# Patient Record
Sex: Male | Born: 1990 | Race: White | Hispanic: No | Marital: Married | Smoking: Current some day smoker
Health system: Southern US, Community
[De-identification: ages and names within clinical notes are randomized; demographics above are authoritative.]

## PROBLEM LIST (undated history)

## (undated) DIAGNOSIS — F419 Anxiety disorder, unspecified: Secondary | ICD-10-CM

## (undated) DIAGNOSIS — F329 Major depressive disorder, single episode, unspecified: Secondary | ICD-10-CM

## (undated) DIAGNOSIS — F1910 Other psychoactive substance abuse, uncomplicated: Secondary | ICD-10-CM

## (undated) DIAGNOSIS — F32A Depression, unspecified: Secondary | ICD-10-CM

## (undated) HISTORY — PX: NO PAST SURGERIES: SHX2092

## (undated) HISTORY — DX: Other psychoactive substance abuse, uncomplicated: F19.10

## (undated) HISTORY — DX: Depression, unspecified: F32.A

## (undated) HISTORY — DX: Anxiety disorder, unspecified: F41.9

## (undated) HISTORY — DX: Major depressive disorder, single episode, unspecified: F32.9

---
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
## 2013-05-14 ENCOUNTER — Inpatient Hospital Stay: Payer: Self-pay | Admitting: Psychiatry

## 2013-05-14 LAB — CBC WITH DIFFERENTIAL/PLATELET
BASOS ABS: 0.1 10*3/uL (ref 0.0–0.1)
BASOS PCT: 1.3 %
EOS PCT: 1.5 %
Eosinophil #: 0.2 10*3/uL (ref 0.0–0.7)
HCT: 45.3 % (ref 40.0–52.0)
HGB: 15.6 g/dL (ref 13.0–18.0)
Lymphocyte #: 2.3 10*3/uL (ref 1.0–3.6)
Lymphocyte %: 21.7 %
MCH: 30.7 pg (ref 26.0–34.0)
MCHC: 34.6 g/dL (ref 32.0–36.0)
MCV: 89 fL (ref 80–100)
Monocyte #: 1.1 x10 3/mm — ABNORMAL HIGH (ref 0.2–1.0)
Monocyte %: 10.7 %
Neutrophil #: 6.8 10*3/uL — ABNORMAL HIGH (ref 1.4–6.5)
Neutrophil %: 64.8 %
PLATELETS: 275 10*3/uL (ref 150–440)
RBC: 5.1 10*6/uL (ref 4.40–5.90)
RDW: 13.8 % (ref 11.5–14.5)
WBC: 10.5 10*3/uL (ref 3.8–10.6)

## 2013-05-14 LAB — URINALYSIS, COMPLETE
BACTERIA: NONE SEEN
BILIRUBIN, UR: NEGATIVE
BLOOD: NEGATIVE
Glucose,UR: NEGATIVE mg/dL (ref 0–75)
KETONE: NEGATIVE
LEUKOCYTE ESTERASE: NEGATIVE
Nitrite: NEGATIVE
Ph: 6 (ref 4.5–8.0)
Protein: NEGATIVE
RBC,UR: <1 /HPF (ref 0–5)
SPECIFIC GRAVITY: 1.018 (ref 1.003–1.030)
Squamous Epithelial: <1
WBC UR: 3 /HPF (ref 0–5)

## 2013-05-14 LAB — COMPREHENSIVE METABOLIC PANEL
ALBUMIN: 4 g/dL (ref 3.4–5.0)
ALT: 18 U/L (ref 12–78)
AST: 31 U/L (ref 15–37)
Alkaline Phosphatase: 79 U/L
Anion Gap: 5 — ABNORMAL LOW (ref 7–16)
BUN: 21 mg/dL — AB (ref 7–18)
Bilirubin,Total: 0.7 mg/dL (ref 0.2–1.0)
CREATININE: 1.49 mg/dL — AB (ref 0.60–1.30)
Calcium, Total: 9.2 mg/dL (ref 8.5–10.1)
Chloride: 101 mmol/L (ref 98–107)
Co2: 31 mmol/L (ref 21–32)
Glucose: 78 mg/dL (ref 65–99)
Osmolality: 276 (ref 275–301)
Potassium: 2.9 mmol/L — ABNORMAL LOW (ref 3.5–5.1)
Sodium: 137 mmol/L (ref 136–145)
Total Protein: 7.5 g/dL (ref 6.4–8.2)

## 2013-05-14 LAB — DRUG SCREEN, URINE
Amphetamines, Ur Screen: NEGATIVE (ref ?–1000)
Barbiturates, Ur Screen: NEGATIVE (ref ?–200)
Benzodiazepine, Ur Scrn: NEGATIVE (ref ?–200)
COCAINE METABOLITE, UR ~~LOC~~: NEGATIVE (ref ?–300)
Cannabinoid 50 Ng, Ur ~~LOC~~: POSITIVE (ref ?–50)
MDMA (Ecstasy)Ur Screen: NEGATIVE (ref ?–500)
METHADONE, UR SCREEN: NEGATIVE (ref ?–300)
Opiate, Ur Screen: NEGATIVE (ref ?–300)
Phencyclidine (PCP) Ur S: NEGATIVE (ref ?–25)
TRICYCLIC, UR SCREEN: NEGATIVE (ref ?–1000)

## 2013-05-14 LAB — ETHANOL: Ethanol %: <0.003 % (ref 0.000–0.080)

## 2013-05-14 LAB — SALICYLATE LEVEL: Salicylates, Serum: <1.7 mg/dL

## 2013-05-14 LAB — ACETAMINOPHEN LEVEL: Acetaminophen: <2 ug/mL

## 2013-06-04 ENCOUNTER — Emergency Department (HOSPITAL_COMMUNITY)
Admission: EM | Admit: 2013-06-04 | Discharge: 2013-06-05 | Disposition: A | Discharge status: Home / Self Care | Payer: Self-pay | Attending: Emergency Medicine | Admitting: Emergency Medicine

## 2013-06-04 ENCOUNTER — Encounter (HOSPITAL_COMMUNITY): Payer: Self-pay | Admitting: Emergency Medicine

## 2013-06-04 DIAGNOSIS — R4182 Altered mental status, unspecified: Secondary | ICD-10-CM | POA: Insufficient documentation

## 2013-06-04 DIAGNOSIS — F1010 Alcohol abuse, uncomplicated: Secondary | ICD-10-CM | POA: Insufficient documentation

## 2013-06-04 LAB — CBC WITH DIFFERENTIAL/PLATELET
BASOS PCT: 0 % (ref 0–1)
Basophils Absolute: 0 10*3/uL (ref 0.0–0.1)
Eosinophils Absolute: 0.1 10*3/uL (ref 0.0–0.7)
Eosinophils Relative: 0 % (ref 0–5)
HCT: 41.8 % (ref 39.0–52.0)
HEMOGLOBIN: 14.8 g/dL (ref 13.0–17.0)
LYMPHS ABS: 0.9 10*3/uL (ref 0.7–4.0)
Lymphocytes Relative: 5 % — ABNORMAL LOW (ref 12–46)
MCH: 30.3 pg (ref 26.0–34.0)
MCHC: 35.4 g/dL (ref 30.0–36.0)
MCV: 85.7 fL (ref 78.0–100.0)
Monocytes Absolute: 0.8 10*3/uL (ref 0.1–1.0)
Monocytes Relative: 4 % (ref 3–12)
NEUTROS ABS: 16.7 10*3/uL — AB (ref 1.7–7.7)
NEUTROS PCT: 90 % — AB (ref 43–77)
Platelets: 223 10*3/uL (ref 150–400)
RBC: 4.88 MIL/uL (ref 4.22–5.81)
RDW: 13.4 % (ref 11.5–15.5)
WBC: 18.5 10*3/uL — AB (ref 4.0–10.5)

## 2013-06-04 LAB — URINALYSIS, ROUTINE W REFLEX MICROSCOPIC
Bilirubin Urine: NEGATIVE
GLUCOSE, UA: NEGATIVE mg/dL
Hgb urine dipstick: NEGATIVE
Ketones, ur: NEGATIVE mg/dL
Leukocytes, UA: NEGATIVE
NITRITE: NEGATIVE
Protein, ur: NEGATIVE mg/dL
Specific Gravity, Urine: 1.003 — ABNORMAL LOW (ref 1.005–1.030)
Urobilinogen, UA: 0.2 mg/dL (ref 0.0–1.0)
pH: 6.5 (ref 5.0–8.0)

## 2013-06-04 LAB — COMPREHENSIVE METABOLIC PANEL
ALBUMIN: 4.1 g/dL (ref 3.5–5.2)
ALT: 13 U/L (ref 0–53)
AST: 23 U/L (ref 0–37)
Alkaline Phosphatase: 70 U/L (ref 39–117)
BUN: 14 mg/dL (ref 6–23)
CHLORIDE: 97 meq/L (ref 96–112)
CO2: 23 mEq/L (ref 19–32)
Calcium: 8.8 mg/dL (ref 8.4–10.5)
Creatinine, Ser: 0.97 mg/dL (ref 0.50–1.35)
GFR calc Af Amer: >90 mL/min (ref >90)
GFR calc non Af Amer: >90 mL/min (ref >90)
GLUCOSE: 96 mg/dL (ref 70–99)
Potassium: 3.3 mEq/L — ABNORMAL LOW (ref 3.7–5.3)
Sodium: 139 mEq/L (ref 137–147)
Total Bilirubin: 0.4 mg/dL (ref 0.3–1.2)
Total Protein: 7.5 g/dL (ref 6.0–8.3)

## 2013-06-04 LAB — RAPID URINE DRUG SCREEN, HOSP PERFORMED
AMPHETAMINES: NOT DETECTED
Barbiturates: NOT DETECTED
Benzodiazepines: NOT DETECTED
Cocaine: NOT DETECTED
Opiates: NOT DETECTED
TETRAHYDROCANNABINOL: NOT DETECTED

## 2013-06-04 LAB — ETHANOL: ALCOHOL ETHYL (B): 253 mg/dL — AB (ref 0–11)

## 2013-06-04 NOTE — ED Notes (Signed)
EMS called to home.  Found patient laying in empty tub.  Parents said friends dropped patient off at home  and left after drinking and possible taking unknown substances.  Patient is lethargic but responds to pain  After receiving haldol from EMS .

## 2013-06-04 NOTE — ED Provider Notes (Signed)
CSN: [id number]     Arrival date & time 06/04/13  2231 History   First MD Initiated Contact with Patient 06/04/13 2250     Chief Complaint  Patient presents with  . Alcohol Intoxication     (Consider location/radiation/quality/duration/timing/severity/associated sxs/prior Treatment) HPI 23 year old male presents to emergency department via EMS with reported alcohol intoxication.  Patient was given Haldol in route due to aggressive behavior.  He cannot give any history.  Level V caveat applies.  Per EMS, patient was dropped off by friends after a night of drinking.  He was found by his parents laying in a tub.  He is hypothermic.  He may of used other substances.  This is unclear.  History reviewed. No pertinent past medical history. History reviewed. No pertinent past surgical history. No family history on file. History  Substance Use Topics  . Smoking status: Never Smoker   . Smokeless tobacco: Not on file  . Alcohol Use: Yes    Review of Systems  Unable to perform ROS: Patient nonverbal      Allergies  Review of patient's allergies indicates no known allergies.  Home Medications  No current outpatient prescriptions on file. BP 116/76  Pulse 81  Temp(Src) 92.8 F (33.8 C) (Rectal)  Resp 18  SpO2 99% Physical Exam  Nursing note and vitals reviewed. Constitutional: He appears well-developed and well-nourished.  Somnolent but reacts to pain  HENT:  Head: Normocephalic and atraumatic.  Nose: Nose normal.  Mouth/Throat: Oropharynx is clear and moist.  Eyes: Conjunctivae and EOM are normal. Pupils are equal, round, and reactive to light.  Neck: Normal range of motion. Neck supple. No JVD present. No tracheal deviation present. No thyromegaly present.  Cardiovascular: Normal rate, regular rhythm, normal heart sounds and intact distal pulses.  Exam reveals no gallop and no friction rub.   No murmur heard. Pulmonary/Chest: Effort normal and breath sounds normal. No  stridor. No respiratory distress. He has no wheezes. He has no rales. He exhibits no tenderness.  Abdominal: Soft. Bowel sounds are normal. He exhibits no distension and no mass. There is no tenderness. There is no rebound and no guarding.  Musculoskeletal: Normal range of motion. He exhibits no edema and no tenderness.  Lymphadenopathy:    He has no cervical adenopathy.  Skin: Skin is warm and dry. No rash noted. No erythema. No pallor.    ED Course  Procedures (including critical care time) Labs Review Labs Reviewed  CBC WITH DIFFERENTIAL - Abnormal; Notable for the following:    WBC 18.5 (*)    Neutrophils Relative % 90 (*)    Neutro Abs 16.7 (*)    Lymphocytes Relative 5 (*)    All other components within normal limits  URINALYSIS, ROUTINE W REFLEX MICROSCOPIC - Abnormal; Notable for the following:    Specific Gravity, Urine 1.003 (*)    All other components within normal limits  URINE RAPID DRUG SCREEN (HOSP PERFORMED)  COMPREHENSIVE METABOLIC PANEL  ETHANOL   Imaging Review No results found.  EKG Interpretation    Date/Time:  Thursday June 04 2013 23:04:10 EST Ventricular Rate:  81 PR Interval:  164 QRS Duration: 113 QT Interval:  392 QTC Calculation: 455 R Axis:   97 Text Interpretation:  Sinus rhythm Borderline intraventricular conduction delay Early repolarization No old tracing to compare Confirmed by Mikle Sternberg  MD, Frederick Klinger ([id number]) on 06/04/2013 11:38:44 PM            MDM   Final diagnoses:  Alcohol abuse  23 year old male with altered mental status.  Labs drawn showed leukocytosis.  He is hypothermic, bear hugger in place.  We'll check alcohol level, and drug screen.  Will closely monitor for improvement in mental status.  4:43 AM Pt now awake, alert.  Reports drinking "all kinds of stuff last night".  No longer combative or hostile.  Does not request or require alcohol treatment at this time.  If able to ambulate, will be able to be d/c home.  Olivia Mackielga M  Camerin Jimenez, MD 06/05/13 [phone]

## 2013-06-05 NOTE — ED Notes (Signed)
Patient ambulated without difficulty. Patient given sandwich and drink. Patient has notified his parents he is up for discharge. Patient is awaiting his parents arrival with dry clothes for discharge.

## 2013-06-05 NOTE — Discharge Instructions (Signed)
Alcohol Problems °Most adults who drink alcohol drink in moderation (not a lot) are at low risk for developing problems related to their drinking. However, all drinkers, including low-risk drinkers, should know about the health risks connected with drinking alcohol. °RECOMMENDATIONS FOR LOW-RISK DRINKING  °Drink in moderation. Moderate drinking is defined as follows:  °· Men - no more than 2 drinks per day. °· Nonpregnant women - no more than 1 drink per day. °· Over age 65 - no more than 1 drink per day. °A standard drink is 12 grams of pure alcohol, which is equal to a 12 ounce bottle of beer or wine cooler, a 5 ounce glass of wine, or 1.5 ounces of distilled spirits (such as whiskey, brandy, vodka, or rum).  °ABSTAIN FROM (DO NOT DRINK) ALCOHOL: °· When pregnant or considering pregnancy. °· When taking a medication that interacts with alcohol. °· If you are alcohol dependent. °· A medical condition that prohibits drinking alcohol (such as ulcer, liver disease, or heart disease). °DISCUSS WITH YOUR CAREGIVER: °· If you are at risk for coronary heart disease, discuss the potential benefits and risks of alcohol use: Light to moderate drinking is associated with lower rates of coronary heart disease in certain populations (for example, men over age 45 and postmenopausal women). Infrequent or nondrinkers are advised not to begin light to moderate drinking to reduce the risk of coronary heart disease so as to avoid creating an alcohol-related problem. Similar protective effects can likely be gained through proper diet and exercise. °· Women and the elderly have smaller amounts of body water than men. As a result women and the elderly achieve a higher blood alcohol concentration after drinking the same amount of alcohol. °· Exposing a fetus to alcohol can cause a broad range of birth defects referred to as Fetal Alcohol Syndrome (FAS) or Alcohol-Related Birth Defects (ARBD). Although FAS/ARBD is connected with excessive  alcohol consumption during pregnancy, studies also have reported neurobehavioral problems in infants born to mothers reporting drinking an average of 1 drink per day during pregnancy. °· Heavier drinking (the consumption of more than 4 drinks per occasion by men and more than 3 drinks per occasion by women) impairs learning (cognitive) and psychomotor functions and increases the risk of alcohol-related problems, including accidents and injuries. °CAGE QUESTIONS:  °· Have you ever felt that you should Cut down on your drinking? °· Have people Annoyed you by criticizing your drinking? °· Have you ever felt bad or Guilty about your drinking? °· Have you ever had a drink first thing in the morning to steady your nerves or get rid of a hangover (Eye opener)? °If you answered positively to any of these questions: You may be at risk for alcohol-related problems if alcohol consumption is:  °· Men: Greater than 14 drinks per week or more than 4 drinks per occasion. °· Women: Greater than 7 drinks per week or more than 3 drinks per occasion. °Do you or your family have a medical history of alcohol-related problems, such as: °· Blackouts. °· Sexual dysfunction. °· Depression. °· Trauma. °· Liver dysfunction. °· Sleep disorders. °· Hypertension. °· Chronic abdominal pain. °· Has your drinking ever caused you problems, such as problems with your family, problems with your work (or school) performance, or accidents/injuries? °· Do you have a compulsion to drink or a preoccupation with drinking? °· Do you have poor control or are you unable to stop drinking once you have started? °· Do you have to drink to   avoid withdrawal symptoms?  Do you have problems with withdrawal such as tremors, nausea, sweats, or mood disturbances?  Does it take more alcohol than in the past to get you high?  Do you feel a strong urge to drink?  Do you change your plans so that you can have a drink?  Do you ever drink in the morning to relieve  the shakes or a hangover? If you have answered a number of the previous questions positively, it may be time for you to talk to your caregivers, family, and friends and see if they think you have a problem. Alcoholism is a chemical dependency that keeps getting worse and will eventually destroy your health and relationships. Many alcoholics end up dead, impoverished, or in prison. This is often the end result of all chemical dependency.  Do not be discouraged if you are not ready to take action immediately.  Decisions to change behavior often involve up and down desires to change and feeling like you cannot decide.  Try to think more seriously about your drinking behavior.  Think of the reasons to quit. WHERE TO GO FOR ADDITIONAL INFORMATION   The National Institute on Alcohol Abuse and Alcoholism (NIAAA) BasicStudents.dk  [employer] on Alcoholism and Drug Dependence (NCADD) www.ncadd.org  American Society of Addiction Medicine (ASAM) RoyalDiary.gl  Document Released: 03/26/2005 Document Revised: 06/18/2011 Document Reviewed: 11/12/2007 [facility] Patient Information 2014 [place], [place].   Alcohol Use Disorder Alcohol use disorder is a mental disorder. It is not a one-time incident of heavy drinking. Alcohol use disorder is the excessive and uncontrollable use of alcohol over time that leads to problems with functioning in one or more areas of daily living. People with this disorder risk harming themselves and others when they drink to excess. Alcohol use disorder also can cause other mental disorders, such as mood and anxiety disorders, and serious physical problems. People with alcohol use disorder often misuse other drugs.  Alcohol use disorder is common and widespread. Some people with this disorder drink alcohol to cope with or escape from negative life events. Others drink to relieve chronic pain or symptoms of mental illness. People with a family history of alcohol use disorder  are at higher risk of losing control and using alcohol to excess.  SYMPTOMS  Signs and symptoms of alcohol use disorder may include the following:   Consumption ofalcohol inlarger amounts or over a longer period of time than intended.  Multiple unsuccessful attempts to cutdown or control alcohol use.   A great deal of time spent obtaining alcohol, using alcohol, or recovering from the effects of alcohol (hangover).  A strong desire or urge to use alcohol (cravings).   Continued use of alcohol despite problems at work, school, or home because of alcohol use.   Continued use of alcohol despite problems in relationships because of alcohol use.  Continued use of alcohol in situations when it is physically hazardous, such as driving a car.  Continued use of alcohol despite awareness of a physical or psychological problem that is likely related to alcohol use. Physical problems related to alcohol use can involve the brain, heart, liver, stomach, and intestines. Psychological problems related to alcohol use include intoxication, depression, anxiety, psychosis, delirium, and dementia.   The need for increased amounts of alcohol to achieve the same desired effect, or a decreased effect from the consumption of the same amount of alcohol (tolerance).  Withdrawal symptoms upon reducing or stopping alcohol use, or alcohol use to reduce or avoid  withdrawal symptoms. Withdrawal symptoms include:  Racing heart.  Hand tremor.  Difficulty sleeping.  Nausea.  Vomiting.  Hallucinations.  Restlessness.  Seizures. DIAGNOSIS Alcohol use disorder is diagnosed through an assessment by your caregiver. Your caregiver may start by asking three or four questions to screen for excessive or problematic alcohol use. To confirm a diagnosis of alcohol use disorder, at least two symptoms (see SYMPTOMS) must be present within a 252-month period. The severity of alcohol use disorder depends on the number of  symptoms:  Mild two or three.  Moderate four or five.  Severe six or more. Your caregiver may perform a physical exam or use results from lab tests to see if you have physical problems resulting from alcohol use. Your caregiver may refer you to a mental health professional for evaluation. TREATMENT  Some people with alcohol use disorder are able to reduce their alcohol use to low-risk levels. Some people with alcohol use disorder need to quit drinking alcohol. When necessary, mental health professionals with specialized training in substance use treatment can help. Your caregiver can help you decide how severe your alcohol use disorder is and what type of treatment you need. The following forms of treatment are available:   Detoxification. Detoxification involves the use of prescription medication to prevent alcohol withdrawal symptoms in the first week after quitting. This is important for people with a history of symptoms of withdrawal and for heavy drinkers who are likely to have withdrawal symptoms. Alcohol withdrawal can be dangerous and, in severe cases, cause death. Detoxification is usually provided in a hospital or in-patient substance use treatment facility.  Counseling or talk therapy. Talk therapy is provided by substance use treatment counselors. It addresses the reasons people use alcohol and ways to keep them from drinking again. The goals of talk therapy are to help people with alcohol use disorder find healthy activities and ways to cope with life stress, to identify and avoid triggers for alcohol use, and to handle cravings, which can cause relapse.  Medication.Different medications can help treat alcohol use disorder through the following actions:  Decrease alcohol cravings.  Decrease the positive reward response felt from alcohol use.  Produce an uncomfortable physical reaction when alcohol is used (aversion therapy).  Support groups. Support groups are run by people who  have quit drinking. They provide emotional support, advice, and guidance. These forms of treatment are often combined. Some people with alcohol use disorder benefit from intensive combination treatment provided by specialized substance use treatment centers. Both inpatient and outpatient treatment programs are available. Document Released: 05/03/2004 Document Revised: 11/26/2012 Document Reviewed: 07/03/2012 [facility] Patient Information 2014 [place].   Alcohol Intoxication Alcohol intoxication occurs when the amount of alcohol that a person has consumed impairs his or her ability to mentally and physically function. Alcohol directly impairs the normal chemical activity of the brain. Drinking large amounts of alcohol can lead to changes in mental function and behavior, and it can cause many physical effects that can be harmful.  Alcohol intoxication can range in severity from mild to very severe. Various factors can affect the level of intoxication that occurs, such as the person's age, gender, weight, frequency of alcohol consumption, and the presence of other medical conditions (such as diabetes, seizures, or heart conditions). Dangerous levels of alcohol intoxication may occur when people drink large amounts of alcohol in a short period (binge drinking). Alcohol can also be especially dangerous when combined with certain prescription medicines or "recreational" drugs. SIGNS AND SYMPTOMS  Some common signs and symptoms of mild alcohol intoxication include:  Loss of coordination.  Changes in mood and behavior.  Impaired judgment.  Slurred speech. As alcohol intoxication progresses to more severe levels, other signs and symptoms will appear. These may include:  Vomiting.  Confusion and impaired memory.  Slowed breathing.  Seizures.  Loss of consciousness. DIAGNOSIS  Your health care provider will take a medical history and perform a physical exam. You will be asked about the  amount and type of alcohol you have consumed. Blood tests will be done to measure the concentration of alcohol in your blood. In many places, your blood alcohol level must be lower than 80 mg/dL (1.61%) to legally drive. However, many dangerous effects of alcohol can occur at much lower levels.  TREATMENT  People with alcohol intoxication often do not require treatment. Most of the effects of alcohol intoxication are temporary, and they go away as the alcohol naturally leaves the body. Your health care provider will monitor your condition until you are stable enough to go home. Fluids are sometimes given through an IV access tube to help prevent dehydration.  HOME CARE INSTRUCTIONS  Do not drive after drinking alcohol.  Stay hydrated. Drink enough water and fluids to keep your urine clear or pale yellow. Avoid caffeine.   Only take over-the-counter or prescription medicines as directed by your health care provider.  SEEK MEDICAL CARE IF:   You have persistent vomiting.   You do not feel better after a few days.  You have frequent alcohol intoxication. Your health care provider can help determine if you should see a substance use treatment counselor. SEEK IMMEDIATE MEDICAL CARE IF:   You become shaky or tremble when you try to stop drinking.   You shake uncontrollably (seizure).   You throw up (vomit) blood. This may be bright red or may look like black coffee grounds.   You have blood in your stool. This may be bright red or may appear as a black, tarry, bad smelling stool.   You become lightheaded or faint.  MAKE SURE YOU:   Understand these instructions.  Will watch your condition.  Will get help right away if you are not doing well or get worse. Document Released: 01/03/2005 Document Revised: 11/26/2012 Document Reviewed: 08/29/2012 [facility] Patient Information 2014 [place], [place].
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
## 2014-10-15 DIAGNOSIS — F32A Depression, unspecified: Secondary | ICD-10-CM | POA: Insufficient documentation

## 2014-10-15 DIAGNOSIS — F1910 Other psychoactive substance abuse, uncomplicated: Secondary | ICD-10-CM | POA: Insufficient documentation

## 2014-10-15 DIAGNOSIS — F419 Anxiety disorder, unspecified: Secondary | ICD-10-CM | POA: Insufficient documentation

## 2014-10-15 DIAGNOSIS — F329 Major depressive disorder, single episode, unspecified: Secondary | ICD-10-CM | POA: Insufficient documentation

## 2014-10-15 DIAGNOSIS — G4700 Insomnia, unspecified: Secondary | ICD-10-CM

## 2014-10-20 ENCOUNTER — Other Ambulatory Visit: Payer: Self-pay | Admitting: Family Medicine

## 2014-10-20 ENCOUNTER — Encounter: Payer: Self-pay | Admitting: Family Medicine

## 2014-10-20 ENCOUNTER — Ambulatory Visit (INDEPENDENT_AMBULATORY_CARE_PROVIDER_SITE_OTHER): Payer: No Typology Code available for payment source | Admitting: Family Medicine

## 2014-10-20 VITALS — BP 118/76 | HR 64 | Temp 98.7°F | Resp 16 | Ht 69.5 in | Wt 129.0 lb

## 2014-10-20 DIAGNOSIS — R1310 Dysphagia, unspecified: Secondary | ICD-10-CM

## 2014-10-20 DIAGNOSIS — F1010 Alcohol abuse, uncomplicated: Secondary | ICD-10-CM | POA: Insufficient documentation

## 2014-10-20 DIAGNOSIS — Z720 Tobacco use: Secondary | ICD-10-CM | POA: Insufficient documentation

## 2014-10-20 DIAGNOSIS — Z0000 Encounter for general adult medical examination without abnormal findings: Secondary | ICD-10-CM

## 2014-10-20 DIAGNOSIS — Z23 Encounter for immunization: Secondary | ICD-10-CM | POA: Diagnosis not present

## 2014-10-20 NOTE — Progress Notes (Signed)
Patient ID: Thomas Mack, male   DOB: [birth year], 24 y.o.   MRN: [id number]        [name], Male    DOB: 14-May-1990, 24 y.o.   MRN: [id number] Visit Date: 10/20/2014  Today's Provider: Lelon Huh, MD   Chief Complaint  Patient presents with  . Annual Exam   Subjective:    Annual physical exam Thomas Mack is a 24 y.o. male who presents today for health maintenance and complete physical. He feels fairly well.  Also having trouble with ear pain and nosebleeds for a few weeks.  He reports not exercising. He reports he is sleeping poorly.  -----------------------------------------------------------------   Lump in throat Pt reports "it feels like he has a lump in his throat" for about six months.  He feels it more when he is trying to swallow food, but he does not choke.  Has had no sinus congestion or drainage, no cough, shortness of breath, heartburn or other reflux symptoms. He drinks beer 2-3 times a week, usually 6-12 beers at each sitting.    Review of Systems  Constitutional: Positive for fatigue. Negative for fever, chills, diaphoresis, activity change, appetite change and unexpected weight change.  HENT: Positive for ear pain and nosebleeds. Negative for congestion, dental problem, drooling, ear discharge, facial swelling, hearing loss, mouth sores, postnasal drip, rhinorrhea, sinus pressure, sneezing, sore throat, tinnitus, trouble swallowing and voice change.   Eyes: Negative for pain and visual disturbance.  Respiratory: Negative.  Negative for cough, chest tightness and shortness of breath.   Cardiovascular: Negative.  Negative for chest pain, palpitations and leg swelling.  Gastrointestinal: Negative.  Negative for nausea, vomiting, abdominal pain, diarrhea, constipation and blood in stool.  Endocrine: Negative.  Negative for polydipsia, polyphagia and polyuria.  Genitourinary: Negative.  Negative for dysuria and flank pain.    Musculoskeletal: Negative.  Negative for myalgias, back pain, joint swelling, arthralgias and neck stiffness.  Skin: Negative.  Negative for color change, rash and wound.  Allergic/Immunologic: Negative.   Neurological: Negative.  Negative for dizziness, tremors, seizures, speech difficulty, weakness, light-headedness and headaches.  Hematological: Negative.   Psychiatric/Behavioral: Negative.  Negative for behavioral problems, confusion, sleep disturbance, dysphoric mood and decreased concentration. The patient is not nervous/anxious.   All other systems reviewed and are negative.   Social History He  reports that he has been smoking Cigarettes.  He has a 6 pack-year smoking history. He has never used smokeless tobacco. He reports that he drinks alcohol. He reports that he does not use illicit drugs.  Patient Active Problem List   Diagnosis Date Noted  . Anxiety 10/15/2014  . Clinical depression 10/15/2014  . Polysubstance abuse 10/15/2014  . Insomnia 10/15/2014    Past Surgical History  Procedure Laterality Date  . No past surgeries      Family History His family history includes Dementia in his other; Drug abuse in his sister; Post-traumatic stress disorder in his mother. There is no history of Skin cancer.    No Known Allergies  Previous Medications   No medications on file    Patient Care Team: Birdie Sons, MD as PCP - General (Family Medicine)     Objective:   Vitals: BP 118/76 mmHg  Pulse 64  Temp(Src) 98.7 F (37.1 C) (Oral)  Resp 16  Ht 5' 9.5" (1.765 m)  Wt 129 lb (58.514 kg)  BMI 18.78 kg/m2   Physical Exam  General Appearance:    Alert, cooperative, no distress,  appears stated age  Head:    Normocephalic, without obvious abnormality, atraumatic  Eyes:    PERRL, conjunctiva/corneas clear, EOM's intact, fundi    benign, both eyes       Ears:    Normal TM's and external ear canals, both ears  Nose:   Nares normal, septum midline, mucosa normal,  no drainage   or sinus tenderness  Throat:   Lips, mucosa, and tongue normal; teeth and gums normal  Neck:   Supple, symmetrical, trachea midline, no adenopathy;       thyroid:  No enlargement/tenderness/nodules; no carotid   bruit or JVD  Back:     Symmetric, no curvature, ROM normal, no CVA tenderness  Lungs:     Clear to auscultation bilaterally, respirations unlabored  Chest wall:    No tenderness or deformity  Heart:    Regular rate and rhythm, S1 and S2 normal, no murmur, rub   or gallop  Abdomen:     Soft, non-tender, bowel sounds active all four quadrants,    no masses, no organomegaly  Genitalia:    deferred  Rectal:    deferred  Extremities:   Extremities normal, atraumatic, no cyanosis or edema  Pulses:   2+ and symmetric all extremities  Skin:   Skin color, texture, turgor normal, no rashes or lesions  Lymph nodes:   Cervical, supraclavicular, and axillary nodes normal  Neurologic:   CNII-XII intact. Normal strength, sensation and reflexes      throughout     Depression Screen No flowsheet data found.    Assessment & Plan:     Routine Health Maintenance and Physical Exam   Immunization History  Administered Date(s) Administered  . DTaP 02/23/1993, 04/26/1993, 07/12/1993, 06/05/1994, 01/07/1998  . Hepatitis B 04-27-1992, 02/23/1993, 07/12/1993  . HiB (PRP-OMP) 02/23/1993, 04/26/1993, 07/12/1993, 06/05/1994  . IPV 02/23/1993, 04/26/1993, 07/12/1993, 01/07/1998  . MMR 06/05/1994, 01/07/1998  . Tdap 10/20/2014    Health Maintenance  Topic Date Due  . HIV Screening  12/03/2007  . TETANUS/TDAP  12/03/2011  . INFLUENZA VACCINE  11/08/2014      Discussed health benefits of physical activity, and encouraged him to engage in regular exercise appropriate for his age and condition.    --------------------------------------------------------------------  1. Annual physical exam  - Lipid panel - TSH - CBC - Comprehensive metabolic panel  2. Need for Tdap  vaccination  - Tdap vaccine greater than or equal to 7yo IM  3. Dysphagia Suspect reflux, likely aggravated by alcohol - DG Esophagus (Barium Swallow); Future  4. Excessive drinking of alcohol Patient Instructions   You should not have any more than 3 alcoholic drinks in a day    5. Tobacco abuse Advised to stop smoking.

## 2014-10-20 NOTE — Patient Instructions (Signed)
   You should not have any more than 3 alcoholic drinks in a day

## 2014-10-21 LAB — COMPREHENSIVE METABOLIC PANEL
ALBUMIN: 4.7 g/dL (ref 3.5–5.5)
ALT: 8 IU/L (ref 0–44)
AST: 21 IU/L (ref 0–40)
Albumin/Globulin Ratio: 1.5 (ref 1.1–2.5)
Alkaline Phosphatase: 86 IU/L (ref 39–117)
BUN / CREAT RATIO: 10 (ref 8–19)
BUN: 13 mg/dL (ref 6–20)
Bilirubin Total: 0.5 mg/dL (ref 0.0–1.2)
CO2: 24 mmol/L (ref 18–29)
CREATININE: 1.25 mg/dL (ref 0.76–1.27)
Calcium: 9.7 mg/dL (ref 8.7–10.2)
Chloride: 99 mmol/L (ref 97–108)
GFR calc Af Amer: 95 mL/min/{1.73_m2} (ref >59)
GFR calc non Af Amer: 82 mL/min/{1.73_m2} (ref >59)
GLOBULIN, TOTAL: 3.2 g/dL (ref 1.5–4.5)
Glucose: 78 mg/dL (ref 65–99)
POTASSIUM: 4.5 mmol/L (ref 3.5–5.2)
Sodium: 144 mmol/L (ref 134–144)
TOTAL PROTEIN: 7.9 g/dL (ref 6.0–8.5)

## 2014-10-21 LAB — CBC
Hematocrit: 46.4 % (ref 37.5–51.0)
Hemoglobin: 16 g/dL (ref 12.6–17.7)
MCH: 29.9 pg (ref 26.6–33.0)
MCHC: 34.5 g/dL (ref 31.5–35.7)
MCV: 87 fL (ref 79–97)
Platelets: 299 10*3/uL (ref 150–379)
RBC: 5.35 x10E6/uL (ref 4.14–5.80)
RDW: 14 % (ref 12.3–15.4)
WBC: 4.9 10*3/uL (ref 3.4–10.8)

## 2014-10-21 LAB — LIPID PANEL
Chol/HDL Ratio: 3.5 ratio units (ref 0.0–5.0)
Cholesterol, Total: 223 mg/dL — ABNORMAL HIGH (ref 100–199)
HDL: 63 mg/dL (ref >39)
LDL CALC: 130 mg/dL — AB (ref 0–99)
Triglycerides: 151 mg/dL — ABNORMAL HIGH (ref 0–149)
VLDL CHOLESTEROL CAL: 30 mg/dL (ref 5–40)

## 2014-10-21 LAB — TSH: TSH: 1.26 u[IU]/mL (ref 0.450–4.500)

## 2014-10-25 ENCOUNTER — Ambulatory Visit
Admission: RE | Admit: 2014-10-25 | Discharge: 2014-10-25 | Disposition: A | Discharge status: Home / Self Care | Payer: No Typology Code available for payment source | Source: Ambulatory Visit | Attending: Family Medicine | Admitting: Family Medicine

## 2014-10-25 DIAGNOSIS — R1310 Dysphagia, unspecified: Secondary | ICD-10-CM | POA: Insufficient documentation

## 2014-10-28 ENCOUNTER — Telehealth: Payer: Self-pay

## 2014-10-28 DIAGNOSIS — K219 Gastro-esophageal reflux disease without esophagitis: Secondary | ICD-10-CM

## 2014-10-28 MED ORDER — OMEPRAZOLE 20 MG PO CPDR: 20.0000 mg | DELAYED_RELEASE_CAPSULE | Freq: Every day | ORAL | Status: DC

## 2014-10-28 NOTE — Telephone Encounter (Signed)
Patient advised and prescription sent into pharmacy. Follow up scheduled by Terri.

## 2014-10-28 NOTE — Telephone Encounter (Signed)
-----   Message from Malva Limes, MD sent at 10/25/2014  3:09 PM EDT ----- Labs and swallow test are all normal. Suspect he has acid reflux causing sensation in his throat. Try Omeprazole,  one tablet daily, #30, rf x 0. Follow up for recheck in 3-4 weeks.

## 2014-11-18 ENCOUNTER — Ambulatory Visit (INDEPENDENT_AMBULATORY_CARE_PROVIDER_SITE_OTHER): Payer: No Typology Code available for payment source | Admitting: Family Medicine

## 2014-11-18 ENCOUNTER — Encounter: Payer: Self-pay | Admitting: Family Medicine

## 2014-11-18 VITALS — BP 94/60 | HR 54 | Temp 98.4°F | Resp 16 | Ht 69.5 in | Wt 135.0 lb

## 2014-11-18 DIAGNOSIS — R1310 Dysphagia, unspecified: Secondary | ICD-10-CM | POA: Insufficient documentation

## 2014-11-18 DIAGNOSIS — K219 Gastro-esophageal reflux disease without esophagitis: Secondary | ICD-10-CM | POA: Insufficient documentation

## 2014-11-18 MED ORDER — OMEPRAZOLE 20 MG PO CPDR: 20.0000 mg | DELAYED_RELEASE_CAPSULE | Freq: Every day | ORAL | Status: DC

## 2014-11-18 NOTE — Progress Notes (Signed)
       [name] Male    DOB: 1990-08-08   24 y.o.   MRN: [id number] Visit Date: 11/18/2014  Today's Provider: Mila Merry, MD   Chief Complaint  Patient presents with  . Follow-up  . Gastrophageal Reflux   Subjective:    HPI Follow-up for sensation in his throat when swallowing for which he was seen 10/20/2014. Had Barium Swallow test on 10/25/2014 which was normal and was started on  Omeprazole 20 mg qd for suspected GERD. Patient reports that he is still having some tightness in his throat when swallows. He states symptoms are much less frequent. Symptoms were constant before he started omeprazole, but are now intermittent, usually only occuring after exercising or when he gets hot. Has no adverse effects from omeprazole.     No Known Allergies Previous Medications   OMEPRAZOLE (PRILOSEC) 20 MG CAPSULE    Take 1 capsule (20 mg total) by mouth daily.    Review of Systems  Constitutional: Negative for fever, chills and appetite change.  Respiratory: Negative for chest tightness, shortness of breath and wheezing.   Cardiovascular: Negative for chest pain and palpitations.  Gastrointestinal: Negative for nausea, vomiting and abdominal pain.    Social History  Substance Use Topics  . Smoking status: Current Some Day Smoker -- 1.00 packs/day for 6 years    Types: Cigarettes  . Smokeless tobacco: Never Used  . Alcohol Use: 0.0 oz/week    0 Standard drinks or equivalent per week     Comment: moderate amount, history of alcohol abuse Drinks beer everyday, the amount varies.   Objective:   BP 94/60 mmHg  Pulse 54  Temp(Src) 98.4 F (36.9 C) (Oral)  Resp 16  Ht 5' 9.5" (1.765 m)  Wt 135 lb (61.236 kg)  BMI 19.66 kg/m2  SpO2 98%  Physical Exam  General appearance: alert, well developed, well nourished, cooperative and in no distress Head: Normocephalic, without obvious abnormality, atraumatic Lungs: Respirations even and unlabored Extremities: No  gross deformities Skin: Skin color, texture, turgor normal. No rashes seen  Psych: Appropriate mood and affect. Neurologic: Mental status: Alert, oriented to person, place, and time, thought content appropriate.       Assessment & Plan:     1. Dysphagia Much improved since starting omeprazole. Likely secondary to GERD as below.   2. Gastroesophageal reflux disease, esophagitis presence not specified  - omeprazole (PRILOSEC) 20 MG capsule; Take 1 capsule (20 mg total) by mouth daily.  Dispense: 30 capsule; Refill: 2 Counseled to not consume more than 2 alcoholic beverages in a day and to stop smoking. Given dietary advice for reflux precautions.   Return in about 2 months (around 01/18/2015). Change to H2 blocker or prn PPI at follow up if sx resolved.         Mila Merry, MD  [facility]

## 2014-11-18 NOTE — Patient Instructions (Signed)

## 2015-01-21 ENCOUNTER — Ambulatory Visit: Payer: No Typology Code available for payment source | Admitting: Family Medicine

## 2015-08-03 ENCOUNTER — Inpatient Hospital Stay (HOSPITAL_COMMUNITY)
Admission: EM | Admit: 2015-08-03 | Discharge: 2015-08-06 | DRG: 184 | Disposition: A | Discharge status: Home / Self Care | Payer: BLUE CROSS/BLUE SHIELD | Attending: General Surgery | Admitting: General Surgery

## 2015-08-03 DIAGNOSIS — Z7289 Other problems related to lifestyle: Secondary | ICD-10-CM

## 2015-08-03 DIAGNOSIS — S270XXA Traumatic pneumothorax, initial encounter: Secondary | ICD-10-CM

## 2015-08-03 DIAGNOSIS — Z79899 Other long term (current) drug therapy: Secondary | ICD-10-CM

## 2015-08-03 DIAGNOSIS — S2220XA Unspecified fracture of sternum, initial encounter for closed fracture: Secondary | ICD-10-CM

## 2015-08-03 DIAGNOSIS — Y903 Blood alcohol level of 60-79 mg/100 ml: Secondary | ICD-10-CM | POA: Diagnosis present

## 2015-08-03 DIAGNOSIS — J939 Pneumothorax, unspecified: Secondary | ICD-10-CM

## 2015-08-03 DIAGNOSIS — S01511A Laceration without foreign body of lip, initial encounter: Secondary | ICD-10-CM | POA: Diagnosis present

## 2015-08-03 DIAGNOSIS — R079 Chest pain, unspecified: Secondary | ICD-10-CM | POA: Diagnosis not present

## 2015-08-03 DIAGNOSIS — S2222XA Fracture of body of sternum, initial encounter for closed fracture: Secondary | ICD-10-CM | POA: Diagnosis not present

## 2015-08-03 DIAGNOSIS — F419 Anxiety disorder, unspecified: Secondary | ICD-10-CM | POA: Diagnosis present

## 2015-08-03 DIAGNOSIS — Y92410 Unspecified street and highway as the place of occurrence of the external cause: Secondary | ICD-10-CM

## 2015-08-03 DIAGNOSIS — F17210 Nicotine dependence, cigarettes, uncomplicated: Secondary | ICD-10-CM | POA: Diagnosis present

## 2015-08-03 DIAGNOSIS — F329 Major depressive disorder, single episode, unspecified: Secondary | ICD-10-CM | POA: Diagnosis present
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
## 2015-08-04 ENCOUNTER — Emergency Department (HOSPITAL_COMMUNITY): Payer: BLUE CROSS/BLUE SHIELD

## 2015-08-04 ENCOUNTER — Encounter (HOSPITAL_COMMUNITY): Payer: Self-pay

## 2015-08-04 DIAGNOSIS — J939 Pneumothorax, unspecified: Secondary | ICD-10-CM | POA: Diagnosis present

## 2015-08-04 LAB — I-STAT CHEM 8, ED
BUN: 14 mg/dL (ref 6–20)
CREATININE: 1.4 mg/dL — AB (ref 0.61–1.24)
Calcium, Ion: 1.12 mmol/L (ref 1.12–1.23)
Chloride: 103 mmol/L (ref 101–111)
Glucose, Bld: 99 mg/dL (ref 65–99)
HEMATOCRIT: 50 % (ref 39.0–52.0)
HEMOGLOBIN: 17 g/dL (ref 13.0–17.0)
POTASSIUM: 3.9 mmol/L (ref 3.5–5.1)
SODIUM: 144 mmol/L (ref 135–145)
TCO2: 26 mmol/L (ref 0–100)

## 2015-08-04 LAB — CBC WITH DIFFERENTIAL/PLATELET
Basophils Absolute: 0 10*3/uL (ref 0.0–0.1)
Basophils Relative: 0 %
EOS ABS: 0 10*3/uL (ref 0.0–0.7)
EOS PCT: 0 %
HCT: 44.7 % (ref 39.0–52.0)
Hemoglobin: 15.4 g/dL (ref 13.0–17.0)
LYMPHS ABS: 0.6 10*3/uL — AB (ref 0.7–4.0)
Lymphocytes Relative: 3 %
MCH: 30.1 pg (ref 26.0–34.0)
MCHC: 34.5 g/dL (ref 30.0–36.0)
MCV: 87.3 fL (ref 78.0–100.0)
MONO ABS: 1.4 10*3/uL — AB (ref 0.1–1.0)
MONOS PCT: 7 %
Neutro Abs: 18.6 10*3/uL — ABNORMAL HIGH (ref 1.7–7.7)
Neutrophils Relative %: 90 %
PLATELETS: 271 10*3/uL (ref 150–400)
RBC: 5.12 MIL/uL (ref 4.22–5.81)
RDW: 14.1 % (ref 11.5–15.5)
WBC: 20.5 10*3/uL — ABNORMAL HIGH (ref 4.0–10.5)

## 2015-08-04 LAB — COMPREHENSIVE METABOLIC PANEL
ALK PHOS: 64 U/L (ref 38–126)
ALT: 34 U/L (ref 17–63)
AST: 50 U/L — ABNORMAL HIGH (ref 15–41)
Albumin: 4.2 g/dL (ref 3.5–5.0)
Anion gap: 10 (ref 5–15)
BUN: 12 mg/dL (ref 6–20)
CHLORIDE: 106 mmol/L (ref 101–111)
CO2: 27 mmol/L (ref 22–32)
Calcium: 9.2 mg/dL (ref 8.9–10.3)
Creatinine, Ser: 1.26 mg/dL — ABNORMAL HIGH (ref 0.61–1.24)
GFR calc Af Amer: >60 mL/min (ref >60)
Glucose, Bld: 99 mg/dL (ref 65–99)
Potassium: 3.9 mmol/L (ref 3.5–5.1)
SODIUM: 143 mmol/L (ref 135–145)
TOTAL PROTEIN: 7.5 g/dL (ref 6.5–8.1)
Total Bilirubin: 0.6 mg/dL (ref 0.3–1.2)

## 2015-08-04 LAB — RAPID URINE DRUG SCREEN, HOSP PERFORMED
AMPHETAMINES: NOT DETECTED
BENZODIAZEPINES: NOT DETECTED
Barbiturates: NOT DETECTED
COCAINE: NOT DETECTED
OPIATES: NOT DETECTED
Tetrahydrocannabinol: NOT DETECTED

## 2015-08-04 LAB — URINE MICROSCOPIC-ADD ON: WBC UA: NONE SEEN WBC/hpf (ref 0–5)

## 2015-08-04 LAB — URINALYSIS, ROUTINE W REFLEX MICROSCOPIC
Bilirubin Urine: NEGATIVE
Glucose, UA: NEGATIVE mg/dL
Ketones, ur: NEGATIVE mg/dL
LEUKOCYTES UA: NEGATIVE
NITRITE: NEGATIVE
PROTEIN: NEGATIVE mg/dL
Specific Gravity, Urine: 1.027 (ref 1.005–1.030)
pH: 6 (ref 5.0–8.0)

## 2015-08-04 LAB — TYPE AND SCREEN
ABO/RH(D): O NEG
ANTIBODY SCREEN: NEGATIVE

## 2015-08-04 LAB — PROTIME-INR
INR: 1.14 (ref 0.00–1.49)
Prothrombin Time: 14.8 seconds (ref 11.6–15.2)

## 2015-08-04 LAB — ABO/RH: ABO/RH(D): O NEG

## 2015-08-04 LAB — ETHANOL: ALCOHOL ETHYL (B): 73 mg/dL — AB (ref <5)

## 2015-08-04 LAB — APTT: aPTT: 27 seconds (ref 24–37)

## 2015-08-04 MED ORDER — IOPAMIDOL (ISOVUE-300) INJECTION 61%
INTRAVENOUS | Status: AC
  Filled 2015-08-04: qty 100

## 2015-08-04 MED ORDER — NAPROXEN 250 MG PO TABS
500.0000 mg | ORAL_TABLET | Freq: Two times a day (BID) | ORAL | Status: DC
  Administered 2015-08-04 – 2015-08-06 (×4): 500 mg via ORAL
  Filled 2015-08-04 (×6): qty 2

## 2015-08-04 MED ORDER — ONDANSETRON HCL 4 MG PO TABS: 4.0000 mg | ORAL_TABLET | Freq: Four times a day (QID) | ORAL | Status: DC | PRN

## 2015-08-04 MED ORDER — ONDANSETRON HCL 4 MG/2ML IJ SOLN
4.0000 mg | Freq: Once | INTRAMUSCULAR | Status: AC
  Administered 2015-08-04: 4 mg via INTRAVENOUS
  Filled 2015-08-04: qty 2

## 2015-08-04 MED ORDER — OXYCODONE HCL 5 MG PO TABS: 5.0000 mg | ORAL_TABLET | ORAL | Status: DC | PRN

## 2015-08-04 MED ORDER — OXYCODONE HCL 5 MG PO TABS
5.0000 mg | ORAL_TABLET | ORAL | Status: DC | PRN
  Administered 2015-08-04 (×2): 15 mg via ORAL
  Administered 2015-08-05: 10 mg via ORAL
  Filled 2015-08-04 (×2): qty 3
  Filled 2015-08-04: qty 2
  Filled 2015-08-04: qty 3

## 2015-08-04 MED ORDER — FENTANYL CITRATE (PF) 100 MCG/2ML IJ SOLN
50.0000 ug | Freq: Once | INTRAMUSCULAR | Status: AC
  Administered 2015-08-04: 50 ug via INTRAVENOUS
  Filled 2015-08-04: qty 2

## 2015-08-04 MED ORDER — POLYETHYLENE GLYCOL 3350 17 G PO PACK
17.0000 g | PACK | Freq: Every day | ORAL | Status: DC
  Administered 2015-08-05 – 2015-08-06 (×2): 17 g via ORAL
  Filled 2015-08-04 (×2): qty 1

## 2015-08-04 MED ORDER — ONDANSETRON HCL 4 MG/2ML IJ SOLN
4.0000 mg | Freq: Four times a day (QID) | INTRAMUSCULAR | Status: DC | PRN
  Administered 2015-08-04 – 2015-08-05 (×2): 4 mg via INTRAVENOUS
  Filled 2015-08-04 (×2): qty 2

## 2015-08-04 MED ORDER — SODIUM CHLORIDE 0.9 % IV SOLN
INTRAVENOUS | Status: DC
Start: 2015-08-04 — End: 2015-08-05
  Administered 2015-08-04: 12:00:00 via INTRAVENOUS
  Administered 2015-08-04: 1000 mL via INTRAVENOUS
  Administered 2015-08-05: 50 mL/h via INTRAVENOUS

## 2015-08-04 MED ORDER — DOCUSATE SODIUM 100 MG PO CAPS
100.0000 mg | ORAL_CAPSULE | Freq: Two times a day (BID) | ORAL | Status: DC
  Administered 2015-08-04 – 2015-08-06 (×4): 100 mg via ORAL
  Filled 2015-08-04 (×5): qty 1

## 2015-08-04 MED ORDER — ENOXAPARIN SODIUM 40 MG/0.4ML ~~LOC~~ SOLN
40.0000 mg | SUBCUTANEOUS | Status: DC
  Administered 2015-08-06: 40 mg via SUBCUTANEOUS
  Filled 2015-08-04 (×2): qty 0.4

## 2015-08-04 MED ORDER — SODIUM CHLORIDE 0.9 % IV BOLUS (SEPSIS)
1000.0000 mL | Freq: Once | INTRAVENOUS | Status: AC
  Administered 2015-08-04: 1000 mL via INTRAVENOUS

## 2015-08-04 MED ORDER — HYDROMORPHONE HCL 1 MG/ML IJ SOLN
0.5000 mg | INTRAMUSCULAR | Status: DC | PRN
  Administered 2015-08-04: 1 mg via INTRAVENOUS
  Filled 2015-08-04: qty 1

## 2015-08-04 MED ORDER — HYDROMORPHONE HCL 1 MG/ML IJ SOLN
0.5000 mg | INTRAMUSCULAR | Status: DC | PRN
Start: 2015-08-04 — End: 2015-08-06
  Administered 2015-08-05 (×3): 0.5 mg via INTRAVENOUS
  Filled 2015-08-04 (×5): qty 1

## 2015-08-04 NOTE — Progress Notes (Signed)
Incentive spirometry at bedside. Patient instructed on use and frequency. Verbalized back understanding of process.

## 2015-08-04 NOTE — ED Notes (Signed)
MD at bedside. 
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
## 2015-08-04 NOTE — ED Notes (Signed)
Patient transported to CT 

## 2015-08-04 NOTE — ED Notes (Signed)
Dr Wyatt at bedside.  

## 2015-08-04 NOTE — Progress Notes (Signed)
Pt 02 sat 100 percent after ambulating 256 feet in hallway. No SOB noted.

## 2015-08-04 NOTE — Progress Notes (Signed)
Pt ambulated 256 feet in hallway with IV pump. Independent for ambulation. Tolerated walk well. Declined anything for pain at this time. No shortness of breath noted.

## 2015-08-05 ENCOUNTER — Observation Stay (HOSPITAL_COMMUNITY): Payer: BLUE CROSS/BLUE SHIELD

## 2015-08-05 DIAGNOSIS — S2222XA Fracture of body of sternum, initial encounter for closed fracture: Secondary | ICD-10-CM | POA: Diagnosis present

## 2015-08-05 DIAGNOSIS — Y903 Blood alcohol level of 60-79 mg/100 ml: Secondary | ICD-10-CM | POA: Diagnosis present

## 2015-08-05 DIAGNOSIS — S01511A Laceration without foreign body of lip, initial encounter: Secondary | ICD-10-CM | POA: Diagnosis present

## 2015-08-05 DIAGNOSIS — Z79899 Other long term (current) drug therapy: Secondary | ICD-10-CM | POA: Diagnosis not present

## 2015-08-05 DIAGNOSIS — F419 Anxiety disorder, unspecified: Secondary | ICD-10-CM | POA: Diagnosis present

## 2015-08-05 DIAGNOSIS — R079 Chest pain, unspecified: Secondary | ICD-10-CM | POA: Diagnosis present

## 2015-08-05 DIAGNOSIS — Y92410 Unspecified street and highway as the place of occurrence of the external cause: Secondary | ICD-10-CM | POA: Diagnosis not present

## 2015-08-05 DIAGNOSIS — Z7289 Other problems related to lifestyle: Secondary | ICD-10-CM | POA: Diagnosis not present

## 2015-08-05 DIAGNOSIS — F17210 Nicotine dependence, cigarettes, uncomplicated: Secondary | ICD-10-CM | POA: Diagnosis present

## 2015-08-05 DIAGNOSIS — S2220XA Unspecified fracture of sternum, initial encounter for closed fracture: Secondary | ICD-10-CM | POA: Diagnosis present

## 2015-08-05 DIAGNOSIS — F329 Major depressive disorder, single episode, unspecified: Secondary | ICD-10-CM | POA: Diagnosis present

## 2015-08-05 DIAGNOSIS — J939 Pneumothorax, unspecified: Secondary | ICD-10-CM | POA: Diagnosis present

## 2015-08-05 MED ORDER — IOPAMIDOL (ISOVUE-300) INJECTION 61%
100.0000 mL | Freq: Once | INTRAVENOUS | Status: AC | PRN
  Administered 2015-08-04: 100 mL via INTRAVENOUS

## 2015-08-05 MED ORDER — OXYCODONE HCL 5 MG PO TABS
2.5000 mg | ORAL_TABLET | ORAL | Status: DC | PRN
  Administered 2015-08-05 – 2015-08-06 (×3): 10 mg via ORAL
  Filled 2015-08-05 (×3): qty 2
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
## 2015-08-06 ENCOUNTER — Inpatient Hospital Stay (HOSPITAL_COMMUNITY): Payer: BLUE CROSS/BLUE SHIELD

## 2015-08-06 MED ORDER — OXYCODONE HCL 5 MG PO TABS: 2.5000 mg | ORAL_TABLET | ORAL | Status: AC | PRN

## 2015-08-06 NOTE — Discharge Instructions (Signed)
Pneumothorax °A pneumothorax, commonly called a collapsed lung, is a condition in which air leaks from a lung and builds up in the space between the lung and the chest wall (pleural space). The air in a pneumothorax is trapped outside the lung and takes up space, preventing the lung from fully expanding. This is a condition that usually occurs suddenly. The buildup of air may be small or large. A small pneumothorax may go away on its own. When a pneumothorax is larger, it will often require medical treatment and hospitalization.  °CAUSES  °A pneumothorax can sometimes happen quickly with no apparent cause. People with underlying lung problems, particularly COPD or emphysema, are at higher risk of pneumothorax. However, pneumothorax can happen quickly even in people with no prior known lung problems. Trauma, surgery, medical procedures, or injury to the chest wall can also cause a pneumothorax. °SIGNS AND SYMPTOMS  °Sometimes a pneumothorax will have no symptoms. When symptoms are present, they can include: °· Chest pain. °· Shortness of breath. °· Increased rate of breathing. °· Bluish color to your lips or skin (cyanosis). °DIAGNOSIS  °Pneumothorax is usually diagnosed by a chest X-ray or chest CT scan. Your health care provider will also take a medical history and perform a physical exam to determine why you may have a pneumothorax. °TREATMENT  °A small pneumothorax may go away on its own without treatment. Extra oxygen can sometimes help a small pneumothorax go away more quickly. For a larger pneumothorax or a pneumothorax that is causing symptoms, a procedure is usually needed to drain the air. In some cases, the health care provider may drain the air using a needle. In other cases, a chest tube may be inserted into the pleural space. A chest tube is a small tube placed between the ribs and into the pleural space. This removes the extra air and allows the lung to expand back to its normal size. A large  pneumothorax will usually require a hospital stay. If there is ongoing air leakage into the pleural space, then the chest tube may need to remain in place for several days until the air leak has healed. In some cases, surgery may be needed.  °HOME CARE INSTRUCTIONS  °· Only take over-the-counter or prescription medicines as directed by your health care provider. °· If a cough or pain makes it difficult for you to sleep at night, try sleeping in a semi-upright position in a recliner or by using 2 or 3 pillows. °· Rest and limit activity as directed by your health care provider. °· If you had a chest tube and it was removed, ask your health care provider when it is okay to remove the dressing. Until your health care provider says you can remove the dressing, do not allow it to get wet. °· Do not smoke. Smoking is a risk factor for pneumothorax. °· Do not fly in an airplane or scuba dive until your health care provider says it is okay. °· Follow up with your health care provider as directed. °SEEK IMMEDIATE MEDICAL CARE IF:  °· You have increasing chest pain or shortness of breath. °· You have a cough that is not controlled with suppressants. °· You begin coughing up blood. °· You have pain that is getting worse or is not controlled with medicines. °· You cough up thick, discolored mucus (sputum) that is yellow to green in color. °· You have redness, increasing pain, or discharge at the site where a chest tube had been in place (if   your pneumothorax was treated with a chest tube). °· The site where your chest tube was located opens up. °· You feel air coming out of the site where the chest tube was placed. °· You have a fever or persistent symptoms for more than 2-3 days. °· You have a fever and your symptoms suddenly get worse. °MAKE SURE YOU:  °· Understand these instructions. °· Will watch your condition. °· Will get help right away if you are not doing well or get worse. °  °This information is not intended to  replace advice given to you by your health care provider. Make sure you discuss any questions you have with your health care provider. °  °Document Released: 03/26/2005 Document Revised: 01/14/2013 Document Reviewed: 10/23/2012 °Elsevier Interactive Patient Education ©2016 [employer] ° °
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
## 2015-08-08 ENCOUNTER — Telehealth (HOSPITAL_COMMUNITY): Payer: Self-pay

## 2015-08-09 NOTE — Telephone Encounter (Signed)
Schedule him appt for 5/10 and [facility] to call and set up CXR beforehand. Also gave refill for Perc 10/325, #20.

## 2015-08-15 ENCOUNTER — Ambulatory Visit (HOSPITAL_COMMUNITY)
Admission: RE | Admit: 2015-08-15 | Discharge: 2015-08-15 | Disposition: A | Discharge status: Home / Self Care | Payer: BLUE CROSS/BLUE SHIELD | Source: Ambulatory Visit | Attending: Orthopedic Surgery | Admitting: Orthopedic Surgery

## 2015-08-15 ENCOUNTER — Other Ambulatory Visit (HOSPITAL_COMMUNITY): Payer: Self-pay | Admitting: Orthopedic Surgery

## 2015-08-15 ENCOUNTER — Telehealth (HOSPITAL_COMMUNITY): Payer: Self-pay

## 2015-08-15 DIAGNOSIS — J939 Pneumothorax, unspecified: Secondary | ICD-10-CM

## 2015-08-15 NOTE — Telephone Encounter (Signed)
Gave rx for Perc 5/325, #20. He's getting CXR today.
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
## 2015-08-22 ENCOUNTER — Telehealth (HOSPITAL_COMMUNITY): Payer: Self-pay

## 2015-08-22 ENCOUNTER — Encounter (INDEPENDENT_AMBULATORY_CARE_PROVIDER_SITE_OTHER): Payer: Self-pay | Admitting: General Surgery

## 2015-08-22 NOTE — Telephone Encounter (Signed)
Letter given for return to work without restrictions, as pain permits, when no pain meds are needed.
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
## 2023-10-04 ENCOUNTER — Telehealth: Payer: Self-pay | Admitting: Family Medicine
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
## 2023-10-22 ENCOUNTER — Ambulatory Visit: Admitting: Physician Assistant
